# Patient Record
Sex: Male | Born: 1935
Health system: Southern US, Community
[De-identification: ages and names within clinical notes are randomized; demographics above are authoritative.]

## PROBLEM LIST (undated history)

## (undated) DIAGNOSIS — R634 Abnormal weight loss: Secondary | ICD-10-CM

## (undated) DIAGNOSIS — I10 Essential (primary) hypertension: Secondary | ICD-10-CM

## (undated) DIAGNOSIS — M109 Gout, unspecified: Secondary | ICD-10-CM

## (undated) DIAGNOSIS — F419 Anxiety disorder, unspecified: Secondary | ICD-10-CM

## (undated) DIAGNOSIS — F0390 Unspecified dementia without behavioral disturbance: Secondary | ICD-10-CM

---

## 2001-12-16 ENCOUNTER — Inpatient Hospital Stay (HOSPITAL_COMMUNITY): Admission: AD | Admit: 2001-12-16 | Discharge: 2001-12-20 | Payer: Self-pay | Admitting: Vascular Surgery

## 2001-12-17 ENCOUNTER — Encounter: Payer: Self-pay | Admitting: Vascular Surgery

## 2008-06-12 ENCOUNTER — Ambulatory Visit: Payer: Self-pay | Admitting: Cardiology

## 2010-05-12 ENCOUNTER — Ambulatory Visit: Payer: Self-pay | Admitting: Gastroenterology

## 2010-05-13 ENCOUNTER — Ambulatory Visit: Payer: Self-pay | Admitting: Gastroenterology

## 2010-05-13 ENCOUNTER — Encounter (INDEPENDENT_AMBULATORY_CARE_PROVIDER_SITE_OTHER): Payer: Self-pay | Admitting: *Deleted

## 2010-05-22 NOTE — Letter (Signed)
Summary: Generic Letter, Intro to Referring  [facility]  [address]   [place], [place] [address]   Phone: [phone]  Fax: [phone]      May 13, 2010             RE: Brandon Trevino   [birth year]                 [address]                 [place], [place]  [address]  Dear [name],  Pt was a no show for his appointment today. Pt goes by "[name]            Sincerely,    Diana Eves  [facility] Ph: [phone]   Fax: [phone]

## 2010-11-12 ENCOUNTER — Telehealth: Payer: Self-pay

## 2010-11-12 NOTE — Telephone Encounter (Signed)
Called pt and no answer.  

## 2010-11-14 NOTE — Telephone Encounter (Signed)
Called. Pt's son, Dorene Sorrow,  answered and said pt was in bed.  I told him reason for the call. He said that his Dad would not have one. He said he is 75 years old and he has never had one and said he would not have one.

## 2010-11-19 NOTE — Telephone Encounter (Signed)
noted 

## 2012-04-11 ENCOUNTER — Emergency Department (HOSPITAL_COMMUNITY)
Admission: EM | Admit: 2012-04-11 | Discharge: 2012-04-12 | Payer: Medicare Other | Attending: Emergency Medicine | Admitting: Emergency Medicine

## 2012-04-11 ENCOUNTER — Emergency Department (HOSPITAL_COMMUNITY): Payer: Medicare Other

## 2012-04-11 ENCOUNTER — Encounter (HOSPITAL_COMMUNITY): Payer: Self-pay | Admitting: Emergency Medicine

## 2012-04-11 DIAGNOSIS — F0280 Dementia in other diseases classified elsewhere without behavioral disturbance: Secondary | ICD-10-CM | POA: Insufficient documentation

## 2012-04-11 DIAGNOSIS — Y939 Activity, unspecified: Secondary | ICD-10-CM | POA: Insufficient documentation

## 2012-04-11 DIAGNOSIS — R509 Fever, unspecified: Secondary | ICD-10-CM | POA: Insufficient documentation

## 2012-04-11 DIAGNOSIS — Z8639 Personal history of other endocrine, nutritional and metabolic disease: Secondary | ICD-10-CM | POA: Insufficient documentation

## 2012-04-11 DIAGNOSIS — M19042 Primary osteoarthritis, left hand: Secondary | ICD-10-CM

## 2012-04-11 DIAGNOSIS — X58XXXA Exposure to other specified factors, initial encounter: Secondary | ICD-10-CM | POA: Insufficient documentation

## 2012-04-11 DIAGNOSIS — G309 Alzheimer's disease, unspecified: Secondary | ICD-10-CM | POA: Insufficient documentation

## 2012-04-11 DIAGNOSIS — F411 Generalized anxiety disorder: Secondary | ICD-10-CM | POA: Insufficient documentation

## 2012-04-11 DIAGNOSIS — I10 Essential (primary) hypertension: Secondary | ICD-10-CM | POA: Insufficient documentation

## 2012-04-11 DIAGNOSIS — F911 Conduct disorder, childhood-onset type: Secondary | ICD-10-CM | POA: Insufficient documentation

## 2012-04-11 DIAGNOSIS — Z862 Personal history of diseases of the blood and blood-forming organs and certain disorders involving the immune mechanism: Secondary | ICD-10-CM | POA: Insufficient documentation

## 2012-04-11 DIAGNOSIS — S6990XA Unspecified injury of unspecified wrist, hand and finger(s), initial encounter: Secondary | ICD-10-CM | POA: Insufficient documentation

## 2012-04-11 DIAGNOSIS — Z7982 Long term (current) use of aspirin: Secondary | ICD-10-CM | POA: Insufficient documentation

## 2012-04-11 DIAGNOSIS — Y929 Unspecified place or not applicable: Secondary | ICD-10-CM | POA: Insufficient documentation

## 2012-04-11 DIAGNOSIS — Z79899 Other long term (current) drug therapy: Secondary | ICD-10-CM | POA: Insufficient documentation

## 2012-04-11 DIAGNOSIS — M12849 Other specific arthropathies, not elsewhere classified, unspecified hand: Secondary | ICD-10-CM | POA: Insufficient documentation

## 2012-04-11 HISTORY — DX: Anxiety disorder, unspecified: F41.9

## 2012-04-11 HISTORY — DX: Unspecified dementia, unspecified severity, without behavioral disturbance, psychotic disturbance, mood disturbance, and anxiety: F03.90

## 2012-04-11 HISTORY — DX: Essential (primary) hypertension: I10

## 2012-04-11 HISTORY — DX: Gout, unspecified: M10.9

## 2012-04-11 HISTORY — DX: Abnormal weight loss: R63.4

## 2012-04-11 NOTE — ED Provider Notes (Signed)
History  This chart was scribed for [employer]. Colon Branch, MD by Erskine Emery, ED Scribe. This patient was seen in room APA05/APA05 and the patient's care was started at 23:02.   CSN: [id number]  Arrival date & time 04/11/12  2221   First MD Initiated Contact with Patient 04/11/12 2302     Level 5 Caveat--Dementia  Chief Complaint  Patient presents with  . Hand Injury  . Fever    (Consider location/radiation/quality/duration/timing/severity/associated sxs/prior Treatment) Level 5 Caveat--Dementia The history is provided by the nursing home and the EMS personnel. The history is limited by the condition of the patient. No language interpreter was used.  ART LEVAN is a 79 y.o. male with a h/o dementia brought in by ambulance, who presents to the Emergency Department from [facility] where staff noticed his right hand was swollen, erythematous, and warm to the touch. Staff also report a fever. Pt was aggressive and uncooperative per EMS. He denies any soreness to his right hand.   Past Medical History  Diagnosis Date  . Dementia   . Hypertension   . Gout   . Anxiety   . Weight loss     History reviewed. No pertinent past surgical history.  History reviewed. No pertinent family history.  History  Substance Use Topics  . Smoking status: Never Smoker   . Smokeless tobacco: Not on file  . Alcohol Use: No      Review of Systems  Unable to perform ROS: Dementia     Allergies  Review of patient's allergies indicates no known allergies.  Home Medications   Current Outpatient Rx  Name  Route  Sig  Dispense  Refill  . ACETAMINOPHEN 500 MG PO TABS   Oral   Take 500 mg by mouth every 4 (four) hours as needed. For headache/minor discomfort         . AMLODIPINE BESYLATE 2.5 MG PO TABS   Oral   Take 2.5 mg by mouth daily.         . ASPIRIN EC 81 MG PO TBEC   Oral   Take 81 mg by mouth daily.         Marland Kitchen CITALOPRAM HYDROBROMIDE 20 MG PO  TABS   Oral   Take 20 mg by mouth daily.         Marland Kitchen DOCUSATE SODIUM 100 MG PO CAPS   Oral   Take 100 mg by mouth at bedtime.         . DONEPEZIL HCL 10 MG PO TABS   Oral   Take 10 mg by mouth at bedtime.         Marland Kitchen MEMANTINE HCL 10 MG PO TABS   Oral   Take 10 mg by mouth 2 (two) times daily.         Marland Kitchen VITAMIN B-12 1000 MCG PO TABS   Oral   Take 1,000 mcg by mouth daily.           Triage Vitals: BP 161/90  Pulse 73  Temp 99.7 F (37.6 C) (Oral)  Resp 20  Ht 6\' 2"  (1.88 m)  Wt 178 lb (80.74 kg)  BMI 22.85 kg/m2  SpO2 95%  Physical Exam  Nursing note and vitals reviewed. Constitutional: He is oriented to person, place, and time. He appears well-developed and well-nourished. No distress.       Pt has an Alzheimer's bracelet on his right leg. He seems comfortable.  HENT:  Head: Normocephalic and  atraumatic.  Eyes: EOM are normal. Pupils are equal, round, and reactive to light.  Neck: Neck supple. No tracheal deviation present.  Cardiovascular: Normal rate.   Pulmonary/Chest: Effort normal. No respiratory distress.  Abdominal: Soft. He exhibits no distension.  Musculoskeletal: Normal range of motion. He exhibits no edema.  Neurological: He is alert and oriented to person, place, and time.  Skin: Skin is warm and dry.  Psychiatric: He has a normal mood and affect.    ED Course  Procedures (including critical care time) DIAGNOSTIC STUDIES: Oxygen Saturation is 95% on room air, adequate by my interpretation.    COORDINATION OF CARE: 23:20--I evaluated the patient.    Dg Wrist Complete Right  04/12/2012  *RADIOLOGY REPORT*  Clinical Data: 79 year old male with right wrist pain, swelling and fever.  Index and fifth digits are missing from remote industrial accident.  RIGHT WRIST - COMPLETE 3+ VIEW  Comparison: None  Findings: Soft tissue swelling is noted. No radiographic evidence of acute osteomyelitis is noted. There is no evidence of acute fracture,  subluxation or dislocation. Degenerative changes within the wrist and first carpometacarpal joint noted. The index finger and fifth metacarpal and little finger missing.  IMPRESSION: Soft tissue swelling without evidence of acute bony abnormality.   Original Report Authenticated By: Harmon Pier, M.D.      MDM  Patient with right hand and no swelling or pain. Xray negative for acute findings.Pt stable in ED with no significant deterioration in condition.The patient appears reasonably screened and/or stabilized for discharge and I doubt any other medical condition or other [facility] requiring further screening, evaluation, or treatment in the ED at this time prior to discharge.  Pt stable in ED with no significant deterioration in condition.The patient appears reasonably screened and/or stabilized for discharge and I doubt any other medical condition or other [facility] requiring further screening, evaluation, or treatment in the ED at this time prior to discharge.  MDM Reviewed: nursing note and vitals Interpretation: x-ray           Nicoletta Dress. Colon Branch, MD 04/12/12 [phone]

## 2012-04-11 NOTE — ED Notes (Signed)
EMS called out to [facility]. Staff note that right hand is swollen, red and warm to touch. Staff also state patient now has a fever. Pt is dementia pt. Pt aggravated and uncooperative at this time.

## 2012-04-12 NOTE — ED Notes (Signed)
Pt picked up by staff member at [facility]

## 2012-04-12 NOTE — ED Notes (Signed)
Called [facility] again, they state the driver on call was late. State she is now on the way.

## 2012-04-12 NOTE — ED Notes (Signed)
Pt sleeping comfortably, still waiting for [facility] staff to arrive to transport pt.

## 2012-04-12 NOTE — ED Notes (Signed)
Spoke with staff at [facility], they state someone will be coming to transport patient back.

## 2014-05-01 ENCOUNTER — Emergency Department (HOSPITAL_COMMUNITY): Payer: Medicare Other

## 2014-05-01 ENCOUNTER — Emergency Department (HOSPITAL_COMMUNITY)
Admission: EM | Admit: 2014-05-01 | Discharge: 2014-05-02 | Disposition: A | Discharge status: Home / Self Care | Payer: Medicare Other | Attending: Emergency Medicine | Admitting: Emergency Medicine

## 2014-05-01 ENCOUNTER — Encounter (HOSPITAL_COMMUNITY): Payer: Self-pay | Admitting: Emergency Medicine

## 2014-05-01 DIAGNOSIS — I10 Essential (primary) hypertension: Secondary | ICD-10-CM | POA: Insufficient documentation

## 2014-05-01 DIAGNOSIS — F0391 Unspecified dementia with behavioral disturbance: Secondary | ICD-10-CM

## 2014-05-01 DIAGNOSIS — Z792 Long term (current) use of antibiotics: Secondary | ICD-10-CM | POA: Diagnosis not present

## 2014-05-01 DIAGNOSIS — Z7982 Long term (current) use of aspirin: Secondary | ICD-10-CM | POA: Insufficient documentation

## 2014-05-01 DIAGNOSIS — F419 Anxiety disorder, unspecified: Secondary | ICD-10-CM | POA: Diagnosis not present

## 2014-05-01 DIAGNOSIS — Z79899 Other long term (current) drug therapy: Secondary | ICD-10-CM | POA: Insufficient documentation

## 2014-05-01 DIAGNOSIS — Z8739 Personal history of other diseases of the musculoskeletal system and connective tissue: Secondary | ICD-10-CM | POA: Diagnosis not present

## 2014-05-01 DIAGNOSIS — R55 Syncope and collapse: Secondary | ICD-10-CM | POA: Diagnosis present

## 2014-05-01 MED ORDER — SODIUM CHLORIDE 0.9 % IV BOLUS (SEPSIS): 1000.0000 mL | Freq: Once | INTRAVENOUS | Status: DC

## 2014-05-01 NOTE — ED Notes (Addendum)
Per EMS- pt had questionable syncopal episode at assisted living facility and was assisted to floor. Pt alert and vss uon EMS arrival.  Pt alert and confused (screaming profanity) upon ED arrival.

## 2014-05-01 NOTE — Discharge Instructions (Signed)
Follow-up your primary care doctor. °

## 2014-05-01 NOTE — ED Provider Notes (Signed)
CSN: [id number]     Arrival date & time 05/01/14  1749 History   First MD Initiated Contact with Patient 05/01/14 1757     Chief Complaint  Patient presents with  . Loss of Consciousness     (Consider location/radiation/quality/duration/timing/severity/associated sxs/prior Treatment) HPI.... Level V caveat for profound dementia. Uncertain history given by EMS. Patient allegedly had a syncopal spell and was assisted to the floor at his nursing home. He has now been back to baseline which essentially is profound dementia with behavioral disturbances.  No specific complaints of chest pain, dyspnea, fever, chills, dysuria.  Past Medical History  Diagnosis Date  . Dementia   . Hypertension   . Gout   . Anxiety   . Weight loss    History reviewed. No pertinent past surgical history. No family history on file. History  Substance Use Topics  . Smoking status: Never Smoker   . Smokeless tobacco: Not on file  . Alcohol Use: No    Review of Systems  Unable to perform ROS: Dementia      Allergies  Review of patient's allergies indicates no known allergies.  Home Medications   Prior to Admission medications   Medication Sig Start Date End Date Taking? Authorizing Provider  acetaminophen (TYLENOL) 325 MG tablet Take 650 mg by mouth at bedtime.   Yes Historical Provider, MD  acetaminophen (TYLENOL) 500 MG tablet Take 500 mg by mouth every 4 (four) hours as needed. For headache/minor discomfort   Yes Historical Provider, MD  amLODipine (NORVASC) 10 MG tablet Take 10 mg by mouth daily.   Yes Historical Provider, MD  aspirin EC 81 MG tablet Take 81 mg by mouth daily.   Yes Historical Provider, MD  busPIRone (BUSPAR) 5 MG tablet Take 5 mg by mouth 3 (three) times daily.   Yes Historical Provider, MD  docusate sodium (COLACE) 100 MG capsule Take 100 mg by mouth at bedtime.   Yes Historical Provider, MD  donepezil (ARICEPT) 10 MG tablet Take 10 mg by mouth at bedtime.   Yes Historical  Provider, MD  guaiFENesin (ROBITUSSIN) 100 MG/5ML SOLN Take 10 mLs by mouth every 6 (six) hours as needed for cough or to loosen phlegm.   Yes Historical Provider, MD  hydrALAZINE (APRESOLINE) 25 MG tablet Take 25 mg by mouth 4 (four) times daily.   Yes Historical Provider, MD  LORazepam (ATIVAN) 0.5 MG tablet Take 0.5 mg by mouth daily as needed for anxiety.   Yes Historical Provider, MD  memantine (NAMENDA XR) 28 MG CP24 24 hr capsule Take 28 mg by mouth every evening.   Yes Historical Provider, MD  traZODone (DESYREL) 50 MG tablet Take 50 mg by mouth at bedtime.   Yes Historical Provider, MD  amoxicillin-clavulanate (AUGMENTIN) 875-125 MG per tablet Take 1 tablet by mouth 2 (two) times daily.    Historical Provider, MD   BP 122/63 mmHg  Pulse 83  Temp(Src) 98.4 F (36.9 C)  Resp 18  Ht 6' (1.829 m)  Wt 175 lb (79.379 kg)  BMI 23.73 kg/m2  SpO2 100% Physical Exam  Constitutional:  Grumbling and yelling  HENT:  Head: Normocephalic and atraumatic.  Eyes: Conjunctivae and EOM are normal. Pupils are equal, round, and reactive to light.  Neck: Normal range of motion. Neck supple.  Cardiovascular: Normal rate and regular rhythm.   Pulmonary/Chest: Effort normal and breath sounds normal.  Abdominal: Soft. Bowel sounds are normal.  Musculoskeletal:  Unable  Neurological:  Able to state he has  no pain.  Otherwise abbreviated neurological exam secondary to dementia  Skin: Skin is warm and dry.  Psychiatric:  Uncooperative behavior  Nursing note and vitals reviewed.   ED Course  Procedures (including critical care time) Labs Review Labs Reviewed - No data to display  Imaging Review Dg Chest Portable 1 View  05/01/2014   CLINICAL DATA:  Loss of consciousness.  EXAM: PORTABLE CHEST - 1 VIEW  COMPARISON:  None  FINDINGS: Enlarged cardiac and mediastinal contours with tortuosity of the thoracic aorta. Minimal heterogeneous opacities right mid lung. No pleural effusion or pneumothorax.   IMPRESSION: Cardiac contours upper limits of normal.  Tortuosity thoracic aorta.  Minimal heterogeneous opacities right mid lung may represent atelectasis, scarring and or infection. Consider radiographic followup to ensure resolution.   Electronically Signed   By: Annia Belt M.D.   On: 05/01/2014 19:13     EKG Interpretation None      MDM   Final diagnoses:  Dementia, with behavioral disturbance    I had long discussion with son and grandson. They agree that extraordinary testing is not appropriate. Patient is not cooperative with any activities the nurses are attempting to perform. We will return him to the nursing home.  Clinically patient does not appear to have pneumonia    Donnetta Hutching, MD 05/01/14 2029

## 2014-05-01 NOTE — ED Notes (Signed)
Patient alert and disoriented times 4, will not follow commands however spontaneously crosses the midline with all four extremis.. Inappropriate words, patient screaming profanity. Per EMS, they were informed by nursing home that this is the patients cognitive norm, nursing home states patient "stopped breathing while walking" and they were able to "help him down to the floor". Pt was fully alert and stable upon their arrival

## 2014-05-01 NOTE — ED Notes (Signed)
[facility], report given all questions answered. When I inquired about mode of transportation back she informed me that they are capable of transporting patient back to facility however they prefer EMS to bring him, stated I would call and ask.

## 2014-05-01 NOTE — ED Notes (Signed)
Dr. Adriana Simasook spoke with patients family in front of myself regarding patients plan of care. Family on board with not sedating patient to perform medical test.

## 2014-05-01 NOTE — ED Notes (Addendum)
Patient sleeping/resting quietly after initial agitation  at arrival. Radiology here to get portable chest, upon patient being moved/spoken to patient once again became bilge rent, screaming out profanities and agitated with staff. Family in hallway. Dr. Adriana Simasook called and informed of the above by myself, I also expressed concern regarding my safety if needing to gain IV access on patient.VSS. Patient at his medical and cognitive baseline per nursing home staff and his family. Dr. Adriana Simasook will reevaluate and speak with patient's family.

## 2014-05-02 NOTE — ED Notes (Signed)
Pt transported back to facility via EMS.

## 2014-05-15 DEATH — deceased

## 2015-07-27 IMAGING — CR DG CHEST 1V PORT
1 series · 1 of 1 positions shown · non-contrast
Comparison: None

CLINICAL DATA: Loss of consciousness.

EXAM:
PORTABLE CHEST - 1 VIEW

[portable]
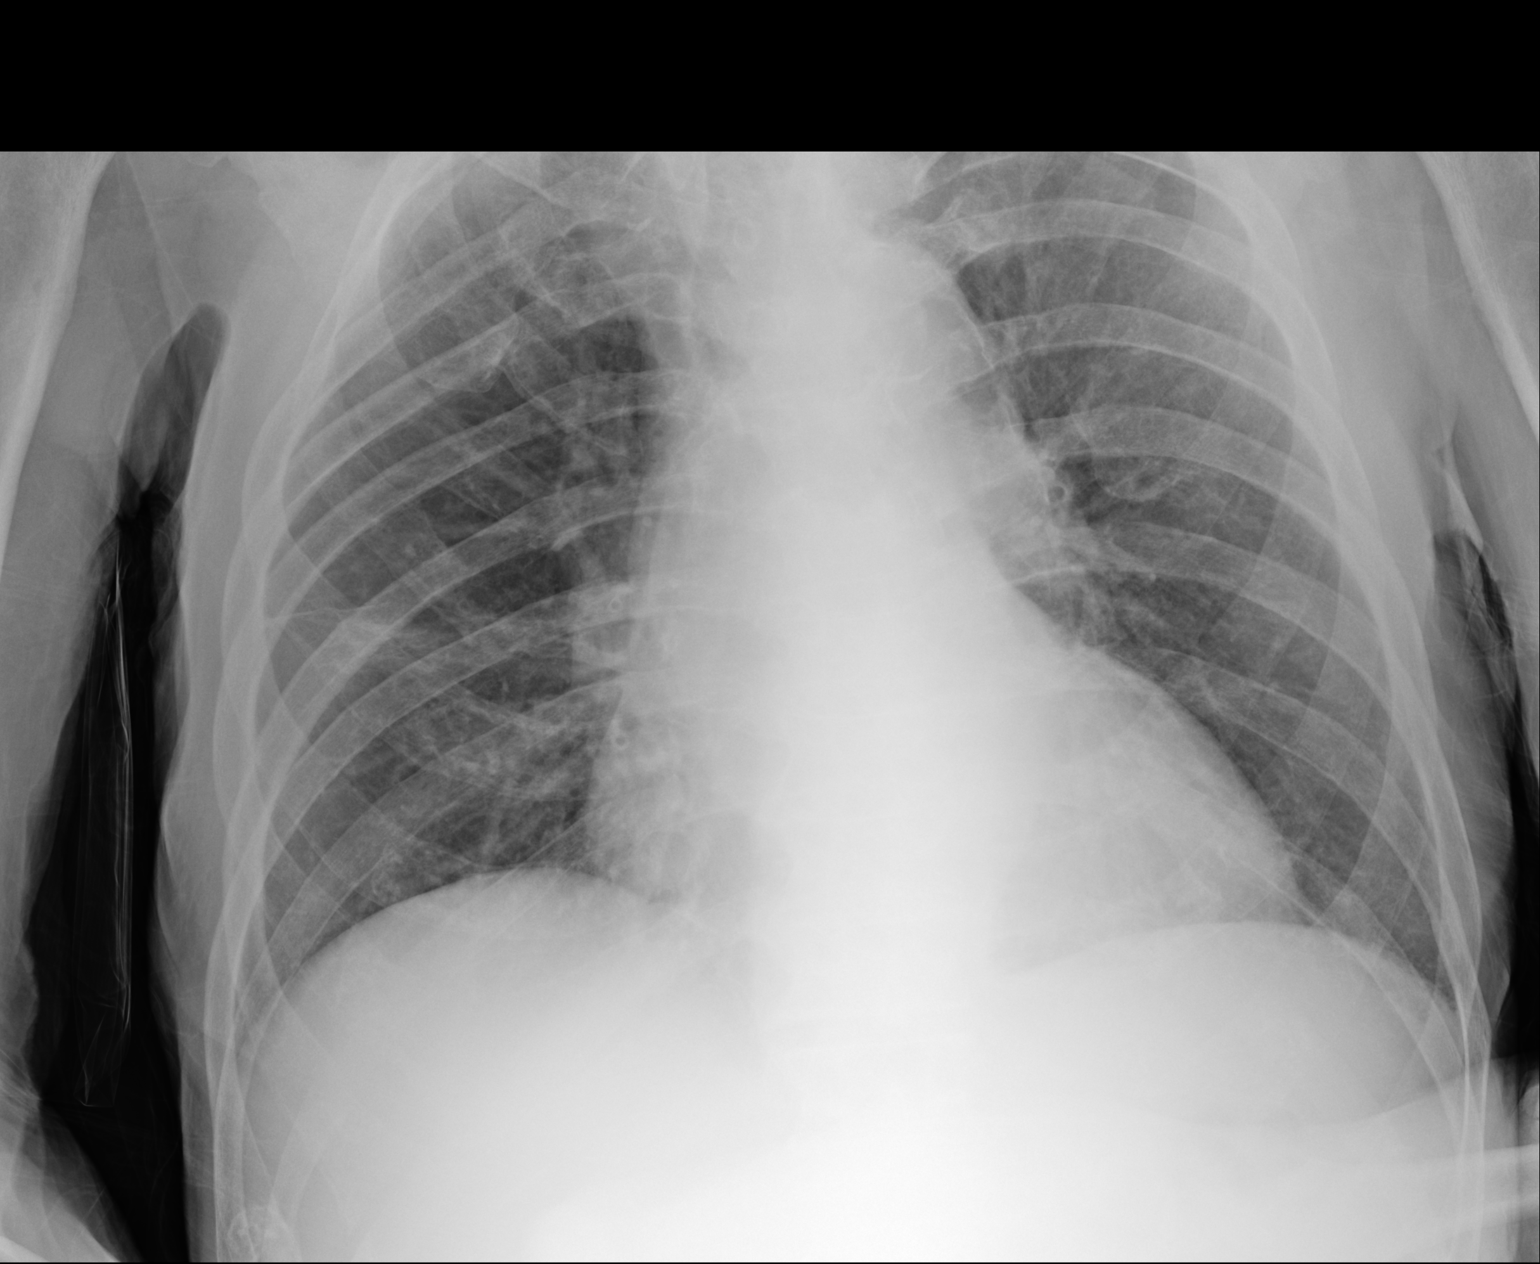

[1 of 1 positions shown; findings below may reference images not displayed]

FINDINGS: Enlarged cardiac and mediastinal contours with tortuosity of the
thoracic aorta. Minimal heterogeneous opacities right mid lung. No
pleural effusion or pneumothorax.
IMPRESSION: Cardiac contours upper limits of normal.

Tortuosity thoracic aorta.

Minimal heterogeneous opacities right mid lung may represent
atelectasis, scarring and or infection. Consider radiographic
followup to ensure resolution.
